# Patient Record
Sex: Female | Born: 2000 | Hispanic: No | Marital: Single | Smoking: Never smoker
Health system: Southern US, Community
[De-identification: ages and names within clinical notes are randomized; demographics above are authoritative.]

## PROBLEM LIST (undated history)

## (undated) VITALS — BP 102/67 | HR 134 | Temp 98.3°F | Resp 16 | Ht 61.81 in | Wt 97.0 lb

## (undated) DIAGNOSIS — F32A Depression, unspecified: Secondary | ICD-10-CM

## (undated) DIAGNOSIS — F329 Major depressive disorder, single episode, unspecified: Secondary | ICD-10-CM

## (undated) HISTORY — DX: Depression, unspecified: F32.A

## (undated) HISTORY — DX: Major depressive disorder, single episode, unspecified: F32.9

---
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
## 2020-01-14 ENCOUNTER — Other Ambulatory Visit: Payer: Self-pay

## 2020-01-14 ENCOUNTER — Ambulatory Visit (INDEPENDENT_AMBULATORY_CARE_PROVIDER_SITE_OTHER): Payer: No Typology Code available for payment source | Admitting: Psychiatry

## 2020-01-14 DIAGNOSIS — F3341 Major depressive disorder, recurrent, in partial remission: Secondary | ICD-10-CM | POA: Diagnosis not present

## 2020-01-14 DIAGNOSIS — F4010 Social phobia, unspecified: Secondary | ICD-10-CM

## 2020-01-14 MED ORDER — MODAFINIL 200 MG PO TABS: 200.0000 mg | ORAL_TABLET | Freq: Every day | ORAL | 0 refills | Status: DC

## 2020-01-14 NOTE — Progress Notes (Signed)
BH MD/PA/NP OP Progress Note  01/14/2020 2:12 PM Connie Hartman  MRN:  [id number] Interview was conducted by phone and I verified that I was speaking with the correct person using two identifiers. I discussed the limitations of evaluation and management by telemedicine and  the availability of in person appointments. Patient expressed understanding and agreed to proceed.  Chief Complaint: Daytime fatigue, falling asleep during the day.  HPI: 19 yo white female with a hx of MDD and social anxiety who has been most recently followed by Dr. Leanora Cover (earlier by Dr. Alyse Low). She has a hx of a single IP psychiatric admission in 2015due to suicidal ideation and self-harm (cutting). She wasthen prescribed escitalopram with good response. Her symptoms (depression, anxiety,low energyand intermittent suicidal thoughts intent, plan or act of self-harm returned within a year of discontinuation of antidepressant.[name] admitted to havinganxietyin socially unfamiliar surroundings (having to talk tounfamiliarpeople or in new situations). She worries about what peoplemaythink of her andjudge her. She was restarted on escitalopram with dose gradually increased to 20 mg. Buspirone was then added and later bupropion XL 150 mg to help with symptom of lack of motivation, inattention which she is describing currently. Finally in September 2019 escitalopram was changed to sertraline which she has stayed on since that time. She denies feeling particularly depressed but reports feeling tired, sleeping "too much" (falls asleep multiple times during the day), having problems with motivation/concentration. We have increased dose of Wellbutrin to 300 mg to combat that but not much has changed. Anxiety while present is not overwhelming. Appetite is normal. 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  Visit Diagnosis:    ICD-10-CM   1. Social anxiety disorder  F40.10   2.  Major depressive disorder, recurrent episode, in partial remission (HCC)  F33.41     Past Psychiatric History: Please see intake H&P.  Past Medical History:  Past Medical History:  Diagnosis Date  . Depression    No past surgical history on file.  Family Psychiatric History: None.  Family History: No family history on file.  Social History:  Social History   Socioeconomic History  . Marital status: Single    Spouse name: Not on file  . Number of children: Not on file  . Years of education: Not on file  . Highest education level: Not on file  Occupational History  . Not on file  Tobacco Use  . Smoking status: Never Smoker  . Smokeless tobacco: Never Used  Substance and Sexual Activity  . Alcohol use: No  . Drug use: No  . Sexual activity: Never    Birth control/protection: Abstinence  Other Topics Concern  . Not on file  Social History Narrative  . Not on file   Social Determinants of Health   Financial Resource Strain:   . Difficulty of Paying Living Expenses:   Food Insecurity:   . Worried About Programme researcher, broadcasting/film/video in the Last Year:   . Barista in the Last Year:   Transportation Needs:   . Freight forwarder (Medical):   Marland Kitchen Lack of Transportation (Non-Medical):   Physical Activity:   . Days of Exercise per Week:   . Minutes of Exercise per Session:   Stress:   . Feeling of Stress :   Social Connections:   . Frequency of Communication with Friends and Family:   . Frequency of Social Gatherings with Friends and Family:   . Attends Religious Services:   .  Active Member of Clubs or Organizations:   . Attends Banker Meetings:   Marland Kitchen Marital Status:     Allergies: No Known Allergies  Metabolic Disorder Labs: No results found for: HGBA1C, MPG No results found for: PROLACTIN No results found for: CHOL, TRIG, HDL, CHOLHDL, VLDL, LDLCALC Lab Results  Component Value Date   TSH 2.444 12/24/2013    Therapeutic Level Labs: No  results found for: LITHIUM No results found for: VALPROATE No components found for:  CBMZ  Current Medications: Current Outpatient Medications  Medication Sig Dispense Refill  . busPIRone (BUSPAR) 15 MG tablet Take one twice/day 180 tablet 3  . modafinil (PROVIGIL) 200 MG tablet Take 1 tablet (200 mg total) by mouth daily. 30 tablet 0  . sertraline (ZOLOFT) 100 MG tablet TAKE 1 TABLET BY MOUTH EVERY DAY 90 tablet 3   No current facility-administered medications for this visit.     Psychiatric Specialty Exam: Review of Systems  Constitutional: Positive for fatigue.  Psychiatric/Behavioral: Positive for decreased concentration. The patient is nervous/anxious.   All other systems reviewed and are negative.   There were no vitals taken for this visit.There is no height or weight on file to calculate BMI.  General Appearance: NA  Eye Contact:  NA  Speech:  Clear and Coherent and Normal Rate  Volume:  Normal  Mood:  Anxious  Affect:  NA  Thought Process:  Goal Directed and Linear  Orientation:  Full (Time, Place, and Person)  Thought Content: Logical   Suicidal Thoughts:  No  Homicidal Thoughts:  No  Memory:  Immediate;   Good Recent;   Good Remote;   Good  Judgement:  Good  Insight:  Fair  Psychomotor Activity:  NA  Concentration:  Concentration: Fair  Recall:  Good  Fund of Knowledge: Good  Language: Good  Akathisia:  Negative  Handed:  Right  AIMS (if indicated): not done  Assets:  Communication Skills Desire for Improvement Financial Resources/Insurance Housing Physical Health Social Support Talents/Skills  ADL's:  Intact  Cognition: Intact  Sleep:  Excessive daytime sleepiness.    Assessment and Plan: 19 yo white female with a hx of MDD and social anxiety who has been most recently followed by Dr. Leanora Cover (earlier by Dr. Alyse Low). She has a hx of a single IP psychiatric admission in 2015due to suicidal ideation and self-harm (cutting). She wasthen  prescribed escitalopram with good response. Her symptoms (depression, anxiety,low energyand intermittent suicidal thoughts intent, plan or act of self-harm returned within a year of discontinuation of antidepressant.[name] admitted to havinganxietyin socially unfamiliar surroundings (having to talk tounfamiliarpeople or in new situations). She worries about what peoplemaythink of her andjudge her. She was restarted on escitalopram with dose gradually increased to 20 mg. Buspirone was then added and later bupropion XL 150 mg to help with symptom of lack of motivation, inattention which she is describing currently. Finally in September 2019 escitalopram was changed to sertraline which she has stayed on since that time. She denies feeling particularly depressed but reports feeling tired, sleeping "too much" (falls asleep multiple times during the day), having problems with motivation/concentration. We have increased dose of Wellbutrin to 300 mg to combat that but not much has changed. Anxiety while present is not overwhelming. Appetite is normal. 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  Dx: MDD in partial remission; Social anxiety disorder; r/o narcolepsy  Plan: We will continue Zoloft 100 mg and Buspar 15 mg both  to HS. We will discontinue Wellbutrin XL 300 mg in AM and instead try modafinil 200 mg in AM for fatigue.concetration/sexcessive daytime sleepiness. Next appointment in a month.The plan was discussed with patient who had an opportunity to ask questions and these were all answered. I spend 95minutes in phone consultation with the patient.   Stephanie Acre, MD 01/14/2020, 2:12 PM

## 2020-02-17 ENCOUNTER — Other Ambulatory Visit (HOSPITAL_COMMUNITY): Payer: Self-pay | Admitting: Psychiatry

## 2020-02-18 ENCOUNTER — Ambulatory Visit (INDEPENDENT_AMBULATORY_CARE_PROVIDER_SITE_OTHER): Payer: No Typology Code available for payment source | Admitting: Psychiatry

## 2020-02-18 ENCOUNTER — Other Ambulatory Visit: Payer: Self-pay

## 2020-02-18 DIAGNOSIS — F3341 Major depressive disorder, recurrent, in partial remission: Secondary | ICD-10-CM | POA: Diagnosis not present

## 2020-02-18 DIAGNOSIS — F4010 Social phobia, unspecified: Secondary | ICD-10-CM

## 2020-02-18 MED ORDER — METHYLPHENIDATE HCL 10 MG PO TABS: 10.0000 mg | ORAL_TABLET | Freq: Two times a day (BID) | ORAL | 0 refills | Status: DC

## 2020-02-18 NOTE — Progress Notes (Signed)
BH MD/PA/NP OP Progress Note  02/18/2020 2:11 PM Connie Hartman  MRN:  [id number] Interview was conducted by phone and I verified that I was speaking with the correct person using two identifiers. I discussed the limitations of evaluation and management by telemedicine and  the availability of in person appointments. Patient expressed understanding and agreed to proceed.  Chief Complaint: "I still feel very tired".  HPI: 19 yo white female with a hx of MDD and social anxiety who has been most recently followed by Dr. Leanora Hartman (earlier by Dr. Alyse Hartman). She has a hx of a single IP psychiatric admission in 2015due to suicidal ideation and self-harm (cutting). She wasthen prescribed escitalopram with good response. Her symptoms (depression, anxiety,Hartman energyand intermittent suicidal thoughts intent, plan or act of self-harm returned within a year of discontinuation of antidepressant.[name] admitted to havinganxietyin socially unfamiliar surroundings (having to talk tounfamiliarpeople or in new situations). She worries about what peoplemaythink of her andjudge her. She was restarted on escitalopram with dose gradually increased to 20 mg. Buspirone was then added and later bupropion XL 150 mg to help with symptom of lack of motivation, inattention which she is describing currently.Finallyin September 2019 escitalopram was changed to sertraline which she has stayed on since that time. She denies feeling particularly depressed but reports feeling tired, sleeping "too much" (falls asleep multiple times during the day), having problems with motivation/concentration.We have increased dose of Wellbutrin to 300 mg to combat that but not much has changed.I then ordered modafinil 200 mg but it required pre-authorization which we were not notified about so it was not done. Anxiety while present is not overwhelming. 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   Visit Diagnosis:    ICD-10-CM   1. Major depressive disorder, recurrent episode, in partial remission (HCC)  F33.41   2. Social anxiety disorder  F40.10     Past Psychiatric History: Please see intake H&P.  Past Medical History:  Past Medical History:  Diagnosis Date  . Depression    No past surgical history on file.  Family Psychiatric History: None.  Family History: No family history on file.  Social History:  Social History   Socioeconomic History  . Marital status: Single    Spouse name: Not on file  . Number of children: Not on file  . Years of education: Not on file  . Highest education level: Not on file  Occupational History  . Not on file  Tobacco Use  . Smoking status: Never Smoker  . Smokeless tobacco: Never Used  Substance and Sexual Activity  . Alcohol use: No  . Drug use: No  . Sexual activity: Never    Birth control/protection: Abstinence  Other Topics Concern  . Not on file  Social History Narrative  . Not on file   Social Determinants of Health   Financial Resource Strain:   . Difficulty of Paying Living Expenses:   Food Insecurity:   . Worried About Programme researcher, broadcasting/film/video in the Last Year:   . Barista in the Last Year:   Transportation Needs:   . Freight forwarder (Medical):   Marland Kitchen Lack of Transportation (Non-Medical):   Physical Activity:   . Days of Exercise per Week:   . Minutes of Exercise per Session:   Stress:   . Feeling of Stress :   Social Connections:   . Frequency of Communication with Friends and Family:   .  Frequency of Social Gatherings with Friends and Family:   . Attends Religious Services:   . Active Member of Clubs or Organizations:   . Attends Banker Meetings:   Marland Kitchen Marital Status:     Allergies: No Known Allergies  Metabolic Disorder Labs: No results found for: HGBA1C, MPG No results found for: PROLACTIN No results found for: CHOL, TRIG, HDL, CHOLHDL, VLDL,  LDLCALC Lab Results  Component Value Date   TSH 2.444 12/24/2013    Therapeutic Level Labs: No results found for: LITHIUM No results found for: VALPROATE No components found for:  CBMZ  Current Medications: Current Outpatient Medications  Medication Sig Dispense Refill  . busPIRone (BUSPAR) 15 MG tablet Take one twice/day 180 tablet 3  . methylphenidate (RITALIN) 10 MG tablet Take 1 tablet (10 mg total) by mouth 2 (two) times daily with breakfast and lunch. 60 tablet 0  . [START ON 03/19/2020] methylphenidate (RITALIN) 10 MG tablet Take 1 tablet (10 mg total) by mouth 2 (two) times daily with breakfast and lunch. 60 tablet 0  . sertraline (ZOLOFT) 100 MG tablet TAKE 1 TABLET BY MOUTH EVERY DAY 90 tablet 3   No current facility-administered medications for this visit.     Psychiatric Specialty Exam: Review of Systems  Constitutional: Positive for fatigue.  Psychiatric/Behavioral: Positive for decreased concentration.  All other systems reviewed and are negative.   There were no vitals taken for this visit.There is no height or weight on file to calculate BMI.  General Appearance: NA  Eye Contact:  NA  Speech:  Clear and Coherent and Normal Rate  Volume:  Normal  Mood:  Euthymic  Affect:  NA  Thought Process:  Goal Directed and Linear  Orientation:  Full (Time, Place, and Person)  Thought Content: Logical   Suicidal Thoughts:  No  Homicidal Thoughts:  No  Memory:  Immediate;   Good Recent;   Good Remote;   Good  Judgement:  Good  Insight:  Good  Psychomotor Activity:  NA  Concentration:  Concentration: Good  Recall:  Good  Fund of Knowledge: Good  Language: Good  Akathisia:  Negative  Handed:  Right  AIMS (if indicated): not done  Assets:  Communication Skills Desire for Improvement Financial Resources/Insurance Housing Physical Health Social Support Talents/Skills  ADL's:  Intact  Cognition: WNL  Sleep:  Good    Assessment and Plan: 19 yo white female  with a hx of MDD and social anxiety who has been most recently followed by Dr. Leanora Hartman (earlier by Dr. Alyse Hartman). She has a hx of a single IP psychiatric admission in 2015due to suicidal ideation and self-harm (cutting). She wasthen prescribed escitalopram with good response. Her symptoms (depression, anxiety,Hartman energyand intermittent suicidal thoughts intent, plan or act of self-harm returned within a year of discontinuation of antidepressant.[name] admitted to havinganxietyin socially unfamiliar surroundings (having to talk tounfamiliarpeople or in new situations). She worries about what peoplemaythink of her andjudge her. She was restarted on escitalopram with dose gradually increased to 20 mg. Buspirone was then added and later bupropion XL 150 mg to help with symptom of lack of motivation, inattention which she is describing currently.Finallyin September 2019 escitalopram was changed to sertraline which she has stayed on since that time. She denies feeling particularly depressed but reports feeling tired, sleeping "too much" (falls asleep multiple times during the day), having problems with motivation/concentration.We have increased dose of Wellbutrin to 300 mg to combat that but not much has changed.I then ordered  modafinil 200 mg but it required pre-authorization which we were not notified about so it was not done. Anxiety while present is not overwhelming. 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  Dx: MDD in partial remission; Social anxiety disorder; r/o narcolepsy  Plan: We will continue Zoloft 100 mg and Buspar 15 mgboth to HS. We will hold off on getting modafinil approved and will try Ritalin 10 mg bid at this time for daytime fatigue/concetration/sexcessive daytime sleepiness.Next appointment in two months.The plan was discussed with patient who had an opportunity to ask questions and these were all  answered. I spend44minutes in phone consultation with the patient.    Stephanie Acre, MD 02/18/2020, 2:11 PM

## 2020-03-12 ENCOUNTER — Ambulatory Visit: Payer: No Typology Code available for payment source | Attending: Internal Medicine

## 2020-03-12 DIAGNOSIS — Z20822 Contact with and (suspected) exposure to covid-19: Secondary | ICD-10-CM

## 2020-03-13 LAB — NOVEL CORONAVIRUS, NAA: SARS-CoV-2, NAA: NOT DETECTED

## 2020-03-13 LAB — SARS-COV-2, NAA 2 DAY TAT

## 2020-04-22 ENCOUNTER — Telehealth (INDEPENDENT_AMBULATORY_CARE_PROVIDER_SITE_OTHER): Payer: No Typology Code available for payment source | Admitting: Psychiatry

## 2020-04-22 ENCOUNTER — Other Ambulatory Visit: Payer: Self-pay

## 2020-04-22 DIAGNOSIS — F3341 Major depressive disorder, recurrent, in partial remission: Secondary | ICD-10-CM

## 2020-04-22 DIAGNOSIS — F4010 Social phobia, unspecified: Secondary | ICD-10-CM | POA: Diagnosis not present

## 2020-04-22 MED ORDER — METHYLPHENIDATE HCL 10 MG PO TABS: 10.0000 mg | ORAL_TABLET | Freq: Two times a day (BID) | ORAL | 0 refills | Status: DC

## 2020-04-22 MED ORDER — METHYLPHENIDATE HCL 10 MG PO TABS
10.0000 mg | ORAL_TABLET | Freq: Two times a day (BID) | ORAL | 0 refills | Status: DC
Start: 2020-06-21 — End: 2020-07-23

## 2020-04-22 MED ORDER — SERTRALINE HCL 100 MG PO TABS: ORAL_TABLET | ORAL | 3 refills | Status: DC

## 2020-04-22 NOTE — Progress Notes (Signed)
BH MD/PA/NP OP Progress Note  04/22/2020 2:07 PM Connie Hartman  MRN:  [id number] Interview was conducted using videoconferencing application and I verified that I was speaking with the correct person using two identifiers. I discussed the limitations of evaluation and management by telemedicine and  the availability of in person appointments. Patient expressed understanding and agreed to proceed. Patient location - home; physician - home office.  Chief Complaint: "Ritalin has been helpful".  HPI: 19 yo white female with a hx of MDD and social anxiety who has been most recently followed by Dr. Leanora Hartman (earlier by Dr. Alyse Hartman). She has a hx of a single IP psychiatric admission in 2015due to suicidal ideation and self-harm (cutting). She wasthen prescribed escitalopram with good response. Her symptoms (depression, anxiety,Hartman energyand intermittent suicidal thoughts intent, plan or act of self-harm returned within a year of discontinuation of antidepressant.Connie Hartman admitted to havinganxietyin socially unfamiliar surroundings (having to talk tounfamiliarpeople or in new situations). She worries about what peoplemaythink of her andjudge her. She was restarted on escitalopram with dose gradually increased to 20 mg. Buspirone was then added and later bupropion XL 150 mg to help with symptom of lack of motivation, inattention which she is describing currently.Finallyin September2019escitalopram was changed to sertraline which she has stayed on since that time. She denies feeling particularly depressed but reports feeling tired, sleeping "too much"(falls asleep multiple times during the day), having problems with motivation/concentration.We haveincreased dose ofWellbutrinto 300 mgto combat that but not much had changed.I then ordered modafinil 200 mg but it required pre-authorization which we were not notified about so it was not done. We then started Ritalin 10 mg bid and Adja reports  marked improvement in energy, focusing. Anxiety is minimal; appetite and sleep normal. 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  Visit Diagnosis:    ICD-10-CM   1. Major depressive disorder, recurrent episode, in partial remission (HCC)  F33.41   2. Social anxiety disorder  F40.10     Past Psychiatric History: Please see intake H&P.  Past Medical History:  Past Medical History:  Diagnosis Date  . Depression    No past surgical history on file.  Family Psychiatric History: None.  Family History: No family history on file.  Social History:  Social History   Socioeconomic History  . Marital status: Single    Spouse name: Not on file  . Number of children: Not on file  . Years of education: Not on file  . Highest education level: Not on file  Occupational History  . Not on file  Tobacco Use  . Smoking status: Never Smoker  . Smokeless tobacco: Never Used  Vaping Use  . Vaping Use: Never used  Substance and Sexual Activity  . Alcohol use: No  . Drug use: No  . Sexual activity: Never    Birth control/protection: Abstinence  Other Topics Concern  . Not on file  Social History Narrative  . Not on file   Social Determinants of Health   Financial Resource Strain:   . Difficulty of Paying Living Expenses:   Food Insecurity:   . Worried About Programme researcher, broadcasting/film/video in the Last Year:   . Barista in the Last Year:   Transportation Needs:   . Freight forwarder (Medical):   Marland Kitchen Lack of Transportation (Non-Medical):   Physical Activity:   . Days of Exercise per Week:   . Minutes of Exercise per Session:   Stress:   . Feeling of Stress :  Social Connections:   . Frequency of Communication with Friends and Family:   . Frequency of Social Gatherings with Friends and Family:   . Attends Religious Services:   . Active Member of Clubs or Organizations:   . Attends Archivist Meetings:   Marland Kitchen Marital Status:     Allergies: No Known  Allergies  Metabolic Disorder Labs: No results found for: HGBA1C, MPG No results found for: PROLACTIN No results found for: CHOL, TRIG, HDL, CHOLHDL, VLDL, LDLCALC Lab Results  Component Value Date   TSH 2.444 12/24/2013    Therapeutic Level Labs: No results found for: LITHIUM No results found for: VALPROATE No components found for:  CBMZ  Current Medications: Current Outpatient Medications  Medication Sig Dispense Refill  . busPIRone (BUSPAR) 15 MG tablet Take one twice/day 180 tablet 3  . methylphenidate (RITALIN) 10 MG tablet Take 1 tablet (10 mg total) by mouth 2 (two) times daily with breakfast and lunch. 60 tablet 0  . methylphenidate (RITALIN) 10 MG tablet Take 1 tablet (10 mg total) by mouth 2 (two) times daily with breakfast and lunch. 60 tablet 0  . methylphenidate (RITALIN) 10 MG tablet Take 1 tablet (10 mg total) by mouth 2 (two) times daily with breakfast and lunch. 60 tablet 0  . [START ON 05/22/2020] methylphenidate (RITALIN) 10 MG tablet Take 1 tablet (10 mg total) by mouth 2 (two) times daily with breakfast and lunch. 60 tablet 0  . [START ON 06/21/2020] methylphenidate (RITALIN) 10 MG tablet Take 1 tablet (10 mg total) by mouth 2 (two) times daily with breakfast and lunch. 60 tablet 0  . sertraline (ZOLOFT) 100 MG tablet TAKE 1 TABLET BY MOUTH EVERY DAY 90 tablet 3   No current facility-administered medications for this visit.    Psychiatric Specialty Exam: Review of Systems  Psychiatric/Behavioral: The patient is nervous/anxious.   All other systems reviewed and are negative.   There were no vitals taken for this visit.There is no height or weight on file to calculate BMI.  General Appearance: Casual and Well Groomed  Eye Contact:  Good  Speech:  Clear and Coherent and Normal Rate  Volume:  Normal  Mood:  Mild anxiety  Affect:  Full Range  Thought Process:  Goal Directed and Linear  Orientation:  Full (Time, Place, and Person)  Thought Content: Logical    Suicidal Thoughts:  No  Homicidal Thoughts:  No  Memory:  Immediate;   Good Recent;   Good Remote;   Good  Judgement:  Good  Insight:  Good  Psychomotor Activity:  Normal  Concentration:  Concentration: Good  Recall:  Good  Fund of Knowledge: Good  Language: Good  Akathisia:  Negative  Handed:  Right  AIMS (if indicated): not done  Assets:  Communication Skills Desire for Improvement Financial Resources/Insurance Housing Physical Health Social Support Vocational/Educational  ADL's:  Intact  Cognition: WNL  Sleep:  Good   Assessment and Plan: 19 yo white female with a hx of MDD and social anxiety who has been most recently followed by Dr. Renold Genta (earlier by Dr. Eduard Clos). She has a hx of a single IP psychiatric admission in 2015due to suicidal ideation and self-harm (cutting). She wasthen prescribed escitalopram with good response. Her symptoms (depression, anxiety,Hartman energyand intermittent suicidal thoughts intent, plan or act of self-harm returned within a year of discontinuation of antidepressant.Connie Hartman admitted to havinganxietyin socially unfamiliar surroundings (having to talk tounfamiliarpeople or in new situations). She worries about what peoplemaythink of  her andjudge her. She was restarted on escitalopram with dose gradually increased to 20 mg. Buspirone was then added and later bupropion XL 150 mg to help with symptom of lack of motivation, inattention which she is describing currently.Finallyin September2019escitalopram was changed to sertraline which she has stayed on since that time. She denies feeling particularly depressed but reports feeling tired, sleeping "too much"(falls asleep multiple times during the day), having problems with motivation/concentration.We haveincreased dose ofWellbutrinto 300 mgto combat that but not much had changed.I then ordered modafinil 200 mg but it required pre-authorization which we were not notified about so  it was not done. We then started Ritalin 10 mg bid and Terie reports marked improvement in energy, focusing. Anxiety is minimal; appetite and sleep normal. 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  Dx: MDD inpartialremission; Social anxiety disorder; r/o narcolepsy  Plan: We will continue Zoloft 100 mg, Buspar 15 mgboth to HS and Ritalin 10 mg bid for daytime fatigue/concetration/sexcessive daytime sleepiness.Next appointment inthree months.The plan was discussed with patient who had an opportunity to ask questions and these were all answered. I spend12minutes in videoconference with the patient.    Magdalene Patricia, MD 04/22/2020, 2:07 PM

## 2020-05-26 ENCOUNTER — Ambulatory Visit: Payer: Medicaid Other | Attending: Internal Medicine

## 2020-05-26 DIAGNOSIS — Z20822 Contact with and (suspected) exposure to covid-19: Secondary | ICD-10-CM

## 2020-05-27 LAB — NOVEL CORONAVIRUS, NAA: SARS-CoV-2, NAA: NOT DETECTED

## 2020-05-27 LAB — SARS-COV-2, NAA 2 DAY TAT

## 2020-07-08 ENCOUNTER — Telehealth (HOSPITAL_COMMUNITY): Payer: Self-pay | Admitting: *Deleted

## 2020-07-08 NOTE — Telephone Encounter (Signed)
Got it!     Thanks =).

## 2020-07-08 NOTE — Telephone Encounter (Signed)
Opened in error

## 2020-07-08 NOTE — Telephone Encounter (Signed)
Pt called requesting refill of Ritalin 10 mg. Pt has an upcoming appointment on 07/23/20. Please review.

## 2020-07-08 NOTE — Telephone Encounter (Signed)
This is Dr Hinton Dyer patient. Let me know if he is on PAL. Thanks

## 2020-07-23 ENCOUNTER — Telehealth (HOSPITAL_COMMUNITY): Payer: No Typology Code available for payment source | Admitting: Psychiatry

## 2020-07-23 ENCOUNTER — Other Ambulatory Visit: Payer: Self-pay

## 2020-07-23 ENCOUNTER — Other Ambulatory Visit (HOSPITAL_COMMUNITY): Payer: Self-pay | Admitting: Psychiatry

## 2020-07-23 MED ORDER — METHYLPHENIDATE HCL 10 MG PO TABS: 10.0000 mg | ORAL_TABLET | Freq: Two times a day (BID) | ORAL | 0 refills | Status: DC

## 2020-08-05 ENCOUNTER — Telehealth (INDEPENDENT_AMBULATORY_CARE_PROVIDER_SITE_OTHER): Payer: Self-pay | Admitting: Psychiatry

## 2020-08-05 ENCOUNTER — Other Ambulatory Visit: Payer: Self-pay

## 2020-08-05 DIAGNOSIS — F3341 Major depressive disorder, recurrent, in partial remission: Secondary | ICD-10-CM

## 2020-08-05 DIAGNOSIS — F4010 Social phobia, unspecified: Secondary | ICD-10-CM

## 2020-08-05 MED ORDER — SERTRALINE HCL 100 MG PO TABS: ORAL_TABLET | ORAL | 3 refills | Status: DC

## 2020-08-05 MED ORDER — METHYLPHENIDATE HCL 10 MG PO TABS
10.0000 mg | ORAL_TABLET | Freq: Two times a day (BID) | ORAL | 0 refills | Status: DC
Start: 2020-08-05 — End: 2020-11-03

## 2020-08-05 MED ORDER — BUSPIRONE HCL 15 MG PO TABS: ORAL_TABLET | ORAL | 3 refills | Status: DC

## 2020-08-05 MED ORDER — METHYLPHENIDATE HCL 10 MG PO TABS
10.0000 mg | ORAL_TABLET | Freq: Two times a day (BID) | ORAL | 0 refills | Status: DC
Start: 2020-09-04 — End: 2020-11-03

## 2020-08-05 MED ORDER — METHYLPHENIDATE HCL 10 MG PO TABS
10.0000 mg | ORAL_TABLET | Freq: Two times a day (BID) | ORAL | 0 refills | Status: DC
Start: 2020-10-04 — End: 2020-11-03

## 2020-08-05 NOTE — Progress Notes (Signed)
BH MD/PA/NP OP Progress Note  08/05/2020 3:41 PM Connie Hartman  MRN:  [id number] Interview was conducted by phone and I verified that I was speaking with the correct person using two identifiers. I discussed the limitations of evaluation and management by telemedicine and  the availability of in person appointments. Patient expressed understanding and agreed to proceed. Patient location - home; physician - home office.  Chief Complaint: Fatigue, anxiety.  HPI: 19 yo white female with a hx of MDD and social anxiety. She has a hx of a single IP psychiatric admission in 2015due to suicidal ideation and self-harm (cutting). She wasthen prescribed escitalopram with good response. Her symptoms (depression, anxiety,low energyand intermittent suicidal thoughts intent, plan or act of self-harm returned within a year of discontinuation of antidepressant.[name] admitted to havinganxietyin socially unfamiliar surroundings (having to talk tounfamiliarpeople or in new situations). She worries about what peoplemaythink of her andjudge her. She was restarted on escitalopram with dose gradually increased to 20 mg. Buspirone was then added and later bupropion XL 150 mg to help with symptom of lack of motivation, inattention which she is describing currently.Finallyin September2019escitalopram was changed to sertraline which she has stayed on since that time. She denies feeling particularly depressed but reports feeling tired, sleeping "too much"(falls asleep multiple times during the day), having problems with motivation/concentration.We haveincreased dose ofWellbutrinto 300 mgto combat that but not much has changed.I then ordered modafinil 200 mg but it required pre-authorization which we were not notified about so it was not done. Anxiety while present is not overwhelming. We have added Ritalin 109 mg bd for fatigue (possible narcolepsy) and she noticed clear improvement. Deari mostly needs to  take it just once daily in AM. 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   Visit Diagnosis:    ICD-10-CM   1. Major depressive disorder, recurrent episode, in partial remission (HCC)  F33.41   2. Social anxiety disorder  F40.10     Past Psychiatric History: Please see intake H&P.  Past Medical History:  Past Medical History:  Diagnosis Date  . Depression    No past surgical history on file.  Family Psychiatric History: None.  Family History: No family history on file.  Social History:  Social History   Socioeconomic History  . Marital status: Single    Spouse name: Not on file  . Number of children: Not on file  . Years of education: Not on file  . Highest education level: Not on file  Occupational History  . Not on file  Tobacco Use  . Smoking status: Never Smoker  . Smokeless tobacco: Never Used  Vaping Use  . Vaping Use: Never used  Substance and Sexual Activity  . Alcohol use: No  . Drug use: No  . Sexual activity: Never    Birth control/protection: Abstinence  Other Topics Concern  . Not on file  Social History Narrative  . Not on file   Social Determinants of Health   Financial Resource Strain:   . Difficulty of Paying Living Expenses: Not on file  Food Insecurity:   . Worried About Programme researcher, broadcasting/film/video in the Last Year: Not on file  . Ran Out of Food in the Last Year: Not on file  Transportation Needs:   . Lack of Transportation (Medical): Not on file  . Lack of Transportation (Non-Medical): Not on file  Physical Activity:   . Days of Exercise per Week: Not on file  . Minutes  of Exercise per Session: Not on file  Stress:   . Feeling of Stress : Not on file  Social Connections:   . Frequency of Communication with Friends and Family: Not on file  . Frequency of Social Gatherings with Friends and Family: Not on file  . Attends Religious Services: Not on file  . Active Member of  Clubs or Organizations: Not on file  . Attends Banker Meetings: Not on file  . Marital Status: Not on file    Allergies: No Known Allergies  Metabolic Disorder Labs: No results found for: HGBA1C, MPG No results found for: PROLACTIN No results found for: CHOL, TRIG, HDL, CHOLHDL, VLDL, LDLCALC Lab Results  Component Value Date   TSH 2.444 12/24/2013    Therapeutic Level Labs: No results found for: LITHIUM No results found for: VALPROATE No components found for:  CBMZ  Current Medications: Current Outpatient Medications  Medication Sig Dispense Refill  . busPIRone (BUSPAR) 15 MG tablet Take one twice/day 180 tablet 3  . methylphenidate (RITALIN) 10 MG tablet Take 1 tablet (10 mg total) by mouth 2 (two) times daily with breakfast and lunch. 60 tablet 0  . [START ON 09/04/2020] methylphenidate (RITALIN) 10 MG tablet Take 1 tablet (10 mg total) by mouth 2 (two) times daily with breakfast and lunch. 60 tablet 0  . [START ON 10/04/2020] methylphenidate (RITALIN) 10 MG tablet Take 1 tablet (10 mg total) by mouth 2 (two) times daily with breakfast and lunch. 60 tablet 0  . sertraline (ZOLOFT) 100 MG tablet TAKE 1 TABLET BY MOUTH IN THE EVENING 90 tablet 3   No current facility-administered medications for this visit.       Psychiatric Specialty Exam: Review of Systems  Constitutional: Positive for fatigue.  Psychiatric/Behavioral: The patient is nervous/anxious.     There were no vitals taken for this visit.There is no height or weight on file to calculate BMI.  General Appearance: NA  Eye Contact:  NA  Speech:  Clear and Coherent and Normal Rate  Volume:  Normal  Mood:  Anxious  Affect:  NA  Thought Process:  Goal Directed and Linear  Orientation:  Full (Time, Place, and Person)  Thought Content: Logical   Suicidal Thoughts:  No  Homicidal Thoughts:  No  Memory:  Immediate;   Good Recent;   Good Remote;   Good  Judgement:  Good  Insight:  Good   Psychomotor Activity:  NA  Concentration:  Concentration: Good  Recall:  Good  Fund of Knowledge: Good  Language: Good  Akathisia:  Negative  Handed:  Right  AIMS (if indicated): not done  Assets:  Communication Skills Desire for Improvement Financial Resources/Insurance Housing Physical Health Vocational/Educational  ADL's:  Intact  Cognition: WNL  Sleep:  Good     Assessment and Plan: 19 yo white female with a hx of MDD and social anxiety. She has a hx of a single IP psychiatric admission in 2015due to suicidal ideation and self-harm (cutting). She wasthen prescribed escitalopram with good response. Her symptoms such as depression, anxiety,low energyand intermittent suicidal thoughts intent, plan or act of self-harm returned within a year of discontinuation of antidepressant.[name] admitted to havinganxietyin socially unfamiliar surroundings (having to talk tounfamiliarpeople or in new situations). She worries about what peoplemaythink of her andjudge her. She was restarted on escitalopram with dose gradually increased to 20 mg. Buspirone was then added and later bupropion XL 150 mg to help with symptom of lack of motivation, inattention which she  is describing currently.Finallyin September2019escitalopram was changed to sertraline which she has stayed on since that time. She denies feeling particularly depressed but reports feeling tired, sleeping "too much"(falls asleep multiple times during the day), having problems with motivation/concentration.We haveincreased dose ofWellbutrinto 300 mgto combat that but not much has changed.I then ordered modafinil 200 mg but it required pre-authorization which we were not notified about so it was not done. Anxiety while present is not overwhelming. We have added Ritalin 109 mg bd for fatigue (possible narcolepsy) and she noticed clear improvement. Shanequia mostly needs to take it just once daily in AM. 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  Dx: MDD inpartialremission; Social anxiety disorder  Plan: We will continue Zoloft 100 mg and Buspar 15 mgboth to HS and Ritalin 10 mg bid at this time for daytime fatigue/concetration/sexcessive daytime sleepiness.Next appointment inthree months.The plan was discussed with patient who had an opportunity to ask questions and these were all answered. I spend22minutes in phone consultation with the patient.   Magdalene Patricia, MD 08/05/2020, 3:41 PM

## 2020-11-03 ENCOUNTER — Telehealth (INDEPENDENT_AMBULATORY_CARE_PROVIDER_SITE_OTHER): Payer: BC Managed Care – PPO | Admitting: Psychiatry

## 2020-11-03 ENCOUNTER — Other Ambulatory Visit: Payer: Self-pay

## 2020-11-03 DIAGNOSIS — F3341 Major depressive disorder, recurrent, in partial remission: Secondary | ICD-10-CM

## 2020-11-03 DIAGNOSIS — F4010 Social phobia, unspecified: Secondary | ICD-10-CM | POA: Diagnosis not present

## 2020-11-03 MED ORDER — METHYLPHENIDATE HCL 10 MG PO TABS: 10.0000 mg | ORAL_TABLET | Freq: Every day | ORAL | 0 refills | Status: DC

## 2020-11-03 NOTE — Progress Notes (Signed)
[facility] MD/PA/NP OP Progress Note  11/03/2020 2:07 PM Connie Hartman  MRN:  [id number] Interview was conducted by phone and I verified that I was speaking with the correct person using two identifiers. I discussed the limitations of evaluation and management by telemedicine and  the availability of in person appointments. Patient expressed understanding and agreed to proceed. Participants in the visit: patient (location - home); physician (location - home office).  Chief Complaint: None.  HPI: 20 yo white female with a hx of MDD and social anxiety. She has a hx of a single IP psychiatric admission in 2015due to suicidal ideation and self-harm (cutting). She wasthen prescribed escitalopram with good response. Her symptoms such as depression, anxiety,low energyand intermittent suicidal thoughts intent, plan or act of self-harm returned within a year of discontinuation of antidepressant.[name] admitted to havinganxietyin socially unfamiliar surroundings (having to talk tounfamiliarpeople or in new situations). She worries about what peoplemaythink of her andjudge her. She was restarted on escitalopram with dose gradually increased to 20 mg. Buspirone was then added and later bupropion XL 150 mg to help with symptom of lack of motivation, inattention which she is describing currently.Finallyin September2019escitalopram was changed to sertraline which she has stayed on since that time. She denies feeling particularly depressed but reports feeling tired, sleeping "too much"(falls asleep multiple times during the day), having problems with motivation/concentration.We haveincreased dose ofWellbutrinto 300 mgto combat that but not much has changed.I then ordered modafinil 200 mg but it required pre-authorization which we were not notified about so it was not done.Anxiety while present is not overwhelming. We have added Ritalin 10 mg ifor fatigue (possible narcolepsy) and she noticed clear  improvement. Connie Hartman mostly needs to take it just once daily in AM. 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   Visit Diagnosis:    ICD-10-CM   1. Major depressive disorder, recurrent episode, in partial remission (Connie Hartman)  F33.41   2. Social anxiety disorder  F40.10     Past Psychiatric History: Please see intake H&P.  Past Medical History:  Past Medical History:  Diagnosis Date  . Depression    No past surgical history on file.  Family Psychiatric History: None.  Family History: No family history on file.  Social History:  Social History   Socioeconomic History  . Marital status: Single    Spouse name: Not on file  . Number of children: Not on file  . Years of education: Not on file  . Highest education level: Not on file  Occupational History  . Not on file  Tobacco Use  . Smoking status: Never Smoker  . Smokeless tobacco: Never Used  Vaping Use  . Vaping Use: Never used  Substance and Sexual Activity  . Alcohol use: No  . Drug use: No  . Sexual activity: Never    Birth control/protection: Abstinence  Other Topics Concern  . Not on file  Social History Narrative  . Not on file   Social Determinants of Health   Financial Resource Strain: Not on file  Food Insecurity: Not on file  Transportation Needs: Not on file  Physical Activity: Not on file  Stress: Not on file  Social Connections: Not on file    Allergies: No Known Allergies  Metabolic Disorder Labs: No results found for: HGBA1C, MPG No results found for: PROLACTIN No results found for: CHOL, TRIG, HDL, CHOLHDL, VLDL, LDLCALC Lab Results  Component Value Date   TSH 2.444 12/24/2013  Therapeutic Level Labs: No results found for: LITHIUM No results found for: VALPROATE No components found for:  CBMZ  Current Medications: Current Outpatient Medications  Medication Sig Dispense Refill  . methylphenidate (RITALIN) 10 MG  tablet Take 1 tablet (10 mg total) by mouth daily before breakfast. 30 tablet 0  . [START ON 12/03/2020] methylphenidate (RITALIN) 10 MG tablet Take 1 tablet (10 mg total) by mouth daily before breakfast. 30 tablet 0  . [START ON 01/02/2021] methylphenidate (RITALIN) 10 MG tablet Take 1 tablet (10 mg total) by mouth daily before breakfast. 30 tablet 0  . busPIRone (BUSPAR) 15 MG tablet Take one twice/day 180 tablet 3  . sertraline (ZOLOFT) 100 MG tablet TAKE 1 TABLET BY MOUTH IN THE EVENING 90 tablet 3   No current facility-administered medications for this visit.     Psychiatric Specialty Exam: Review of Systems  All other systems reviewed and are negative.   There were no vitals taken for this visit.There is no height or weight on file to calculate BMI.  General Appearance: NA  Eye Contact:  NA  Speech:  Clear and Coherent and Normal Rate  Volume:  Normal  Mood:  Euthymic  Affect:  NA  Thought Process:  Goal Directed  Orientation:  Full (Time, Place, and Person)  Thought Content: Logical   Suicidal Thoughts:  No  Homicidal Thoughts:  No  Memory:  Immediate;   Good Recent;   Good Remote;   Good  Judgement:  Good  Insight:  Good  Psychomotor Activity:  NA  Concentration:  Concentration: Fair  Recall:  Good  Fund of Knowledge: Good  Language: Good  Akathisia:  Negative  Handed:  Right  AIMS (if indicated): not done  Assets:  Communication Skills Desire for Improvement Financial Resources/Insurance Housing Physical Health Social Support Vocational/Educational  ADL's:  Intact  Cognition: WNL  Sleep:  Good    Assessment and Plan: 20 yo white female with a hx of MDD and social anxiety. She has a hx of a single IP psychiatric admission in 2015due to suicidal ideation and self-harm (cutting). She wasthen prescribed escitalopram with good response. Her symptoms such as depression, anxiety,low energyand intermittent suicidal thoughts intent, plan or act of self-harm  returned within a year of discontinuation of antidepressant.[name] admitted to havinganxietyin socially unfamiliar surroundings (having to talk tounfamiliarpeople or in new situations). She worries about what peoplemaythink of her andjudge her. She was restarted on escitalopram with dose gradually increased to 20 mg. Buspirone was then added and later bupropion XL 150 mg to help with symptom of lack of motivation, inattention which she is describing currently.Finallyin September2019escitalopram was changed to sertraline which she has stayed on since that time. She denies feeling particularly depressed but reports feeling tired, sleeping "too much"(falls asleep multiple times during the day), having problems with motivation/concentration.We haveincreased dose ofWellbutrinto 300 mgto combat that but not much has changed.I then ordered modafinil 200 mg but it required pre-authorization which we were not notified about so it was not done.Anxiety while present is not overwhelming. We have added Ritalin 10 mg ifor fatigue (possible narcolepsy) and she noticed clear improvement. Nema mostly needs to take it just once daily in AM. 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  Dx: MDD inpartialremission; Social anxiety disorder  Plan: We will continue Zoloft 100 mg and Buspar 15 mgboth to HS and Ritalin 10 mg in AM for daytime fatigue/concetration/excessive daytime sleepiness.Next appointment inthreemonths.The plan was discussed with  patient who had an opportunity to ask questions and these were all answered. I spend58minutes in phone consultation with the patient.   Magdalene Patricia, MD 11/03/2020, 2:07 PM

## 2021-01-28 ENCOUNTER — Telehealth (HOSPITAL_COMMUNITY): Payer: Self-pay | Admitting: Psychiatry

## 2024-06-05 ENCOUNTER — Encounter: Payer: Self-pay | Admitting: Internal Medicine

## 2024-06-05 ENCOUNTER — Ambulatory Visit: Admitting: Internal Medicine

## 2024-06-05 VITALS — BP 100/70 | HR 94 | Temp 98.0°F | Ht 63.0 in | Wt 108.8 lb

## 2024-06-05 DIAGNOSIS — F3341 Major depressive disorder, recurrent, in partial remission: Secondary | ICD-10-CM | POA: Diagnosis not present

## 2024-06-05 DIAGNOSIS — F4010 Social phobia, unspecified: Secondary | ICD-10-CM

## 2024-06-05 LAB — CBC WITH DIFFERENTIAL/PLATELET
Basophils Absolute: 0.1 K/uL (ref 0.0–0.1)
Basophils Relative: 1.1 % (ref 0.0–3.0)
Eosinophils Absolute: 0.2 K/uL (ref 0.0–0.7)
Eosinophils Relative: 2.6 % (ref 0.0–5.0)
HCT: 42.2 % (ref 36.0–46.0)
Hemoglobin: 14.1 g/dL (ref 12.0–15.0)
Lymphocytes Relative: 26.8 % (ref 12.0–46.0)
Lymphs Abs: 1.9 K/uL (ref 0.7–4.0)
MCHC: 33.4 g/dL (ref 30.0–36.0)
MCV: 87 fl (ref 78.0–100.0)
Monocytes Absolute: 0.6 K/uL (ref 0.1–1.0)
Monocytes Relative: 8.8 % (ref 3.0–12.0)
Neutro Abs: 4.4 K/uL (ref 1.4–7.7)
Neutrophils Relative %: 60.7 % (ref 43.0–77.0)
Platelets: 261 K/uL (ref 150.0–400.0)
RBC: 4.85 Mil/uL (ref 3.87–5.11)
RDW: 13.1 % (ref 11.5–15.5)
WBC: 7.3 K/uL (ref 4.0–10.5)

## 2024-06-05 LAB — VITAMIN D 25 HYDROXY (VIT D DEFICIENCY, FRACTURES): VITD: 34.83 ng/mL (ref 30.00–100.00)

## 2024-06-05 LAB — TSH: TSH: 1.29 u[IU]/mL (ref 0.35–5.50)

## 2024-06-05 NOTE — Progress Notes (Signed)
 [facility] [address] [place] [address] Dept: [phone] Dept Fax: [phone]  New Patient Office Visit  Subjective:   Connie Hartman [birth year] 06/05/2024  Chief Complaint  Patient presents with   Establish Care    Discuss lab work     HPI: Connie Hartman presents today to establish care at [facility]. Introduced to Publishing rights manager role and practice setting.  All questions answered.  Concerns: See below   Discussed the use of AI scribe software for clinical note transcription with the patient, who gave verbal consent to proceed.  History of Present Illness    She is here to establish care as a new patient and to have blood work done as requested by her psychiatrist. The requested tests include thyroid function tests, a complete blood count (CBC), and a vitamin D  level.  She has a history of anxiety and depression and is currently under psychiatric care. She has tried various medications in the past, including sertraline  and venlafaxine, but is currently taking an off-brand version of Vyvanse and desvenlafaxine (Pristiq). Vyvanse has been particularly helpful for her social anxiety and motivation, enabling her to attend this appointment. She reports that her psychiatrist is considering a possible diagnosis of ADHD, and she feels the medication may be helping with her symptoms.  She has not had any surgeries in the past and does not recall her last annual physical, estimating it might have been in 2018 during high school.     06/05/2024    1:35 PM  Depression screen PHQ 2/9  Decreased Interest 0  Down, Depressed, Hopeless 0  PHQ - 2 Score 0  Altered sleeping 0  Tired, decreased energy 0  Change in appetite 0  Feeling bad or failure about yourself  0  Trouble concentrating 0  Moving slowly or fidgety/restless 0  Suicidal thoughts 0  PHQ-9 Score 0  Difficult doing work/chores Not  difficult at all      06/05/2024    1:35 PM  GAD 7 : Generalized Anxiety Score  Nervous, Anxious, on Edge 0  Control/stop worrying 0  Worry too much - different things 0  Trouble relaxing 0  Restless 0  Easily annoyed or irritable 0  Afraid - awful might happen 0  Total GAD 7 Score 0  Anxiety Difficulty Not difficult at all       The following portions of the patient's history were reviewed and updated as appropriate: past medical history, past surgical history, family history, social history, allergies, medications, and problem list.   Patient Active Problem List   Diagnosis Date Noted   Social anxiety disorder 12/22/2013   Major depressive disorder, recurrent episode, in partial remission (HCC) 12/21/2013   Past Medical History:  Diagnosis Date   Depression    History reviewed. No pertinent surgical history. History reviewed. No pertinent family history.  Current Outpatient Medications:    clindamycin (CLEOCIN T) 1 % lotion, Apply to affected areas once daily in the AM, Disp: , Rfl:    desvenlafaxine (PRISTIQ) 50 MG 24 hr tablet, Take by mouth., Disp: , Rfl:    lisdexamfetamine (VYVANSE) 10 MG capsule, Take 10 mg by mouth daily., Disp: , Rfl:    tretinoin (RETIN-A) 0.025 % cream, Apply pea sized amount (1G) to affected areas 3-5 nights a week, as tolerated., Disp: , Rfl:  No Known Allergies  ROS: A complete ROS was performed with pertinent positives/negatives noted in the HPI. The remainder of  the ROS are negative.   Objective:   Today's Vitals   06/05/24 1311  BP: 100/70  Pulse: 94  Temp: 98 F (36.7 C)  TempSrc: Temporal  SpO2: 96%  Weight: 108 lb 12.8 oz (49.4 kg)  Height: 5' 3 (1.6 m)    GENERAL: Well-appearing, in NAD. Well nourished.  SKIN: Pink, warm and dry.  NECK: Trachea midline. Full ROM w/o pain or tenderness. No lymphadenopathy.  RESPIRATORY: Chest wall symmetrical. Respirations even and non-labored. Breath sounds clear to auscultation  bilaterally.  CARDIAC: S1, S2 present, regular rate and rhythm. Peripheral pulses 2+ bilaterally.  EXTREMITIES: Without clubbing, cyanosis, or edema.  NEUROLOGIC: No motor or sensory deficits. Steady, even gait.  PSYCH/MENTAL STATUS: Alert, oriented x 3. Cooperative, appropriate mood and affect.   Health Maintenance Due  Topic Date Due   HIV Screening  Never done   Hepatitis C Screening  Never done   Hepatitis B Vaccines (1 of 3 - 19+ 3-dose series) Never done   Cervical Cancer Screening (Pap smear)  Never done    No results found for any visits on 06/05/24.  Assessment & Plan:  Assessment and Plan    Depression and social anxiety disorder Under psychiatric care. Vyvanse improved social anxiety. Desvenlafaxine restarted, effectiveness pending. - Continue Vyvanse for social anxiety. - Continue desvenlafaxine for depression. - Order blood work: thyroid function tests, CBC, vitamin D  level      Orders Placed This Encounter  Procedures   TSH   CBC w/Diff   VITAMIN D  25 Hydroxy (Vit-D Deficiency, Fractures)   No orders of the defined types were placed in this encounter.   Return in about 4 months (around 10/05/2024) for Annual Physical Exam with fasting lab work.   Rosina Senters, FNP

## 2024-06-06 ENCOUNTER — Ambulatory Visit: Payer: Self-pay | Admitting: Internal Medicine

## 2024-10-04 ENCOUNTER — Encounter: Admitting: Internal Medicine

## 2024-11-12 ENCOUNTER — Ambulatory Visit (INDEPENDENT_AMBULATORY_CARE_PROVIDER_SITE_OTHER): Admitting: Internal Medicine

## 2024-11-12 ENCOUNTER — Encounter: Payer: Self-pay | Admitting: Internal Medicine

## 2024-11-12 VITALS — BP 100/70 | HR 93 | Temp 98.0°F | Ht 62.0 in | Wt 106.2 lb

## 2024-11-12 DIAGNOSIS — Z0000 Encounter for general adult medical examination without abnormal findings: Secondary | ICD-10-CM

## 2024-11-12 NOTE — Patient Instructions (Signed)
Return for fasting blood work

## 2024-11-12 NOTE — Progress Notes (Signed)
 "  Subjective:   Connie Hartman 2001-03-25  11/12/2024   CC: Chief Complaint  Patient presents with   Annual Exam    Pt not fasting, no concerns    HPI: Connie Hartman is a 24 y.o. female who presents for a routine health maintenance exam.  Labs not collected at time of visit. She will return for fasting lab work.    HEALTH SCREENINGS: - Pap smear: declined - Mammogram (40+): Not applicable  - Colonoscopy (45+): Not applicable  - Bone Density (65+): Not applicable  - Lung CA screening with low-dose CT:  Not applicable Adults age 81-80 who are current cigarette smokers or quit within the last 15 years. Must have 20 pack year history.   IMMUNIZATIONS: - Tdap: Tetanus vaccination status reviewed: declined. - HPV: Refused - Influenza: Refused - Prevnar 20: Not applicable - Zostavax (50+): Not applicable   Past medical history, surgical history, medications, allergies, family history and social history reviewed with patient today and changes made to appropriate areas of the chart.   Social History   Socioeconomic History   Marital status: Single    Spouse name: Not on file   Number of children: Not on file   Years of education: Not on file   Highest education level: Some college, no degree  Occupational History   Not on file  Tobacco Use   Smoking status: Never   Smokeless tobacco: Never  Vaping Use   Vaping status: Never Used  Substance and Sexual Activity   Alcohol use: No   Drug use: No   Sexual activity: Never    Birth control/protection: Abstinence  Other Topics Concern   Not on file  Social History Narrative   Not on file   Social Drivers of Health   Tobacco Use: Low Risk (11/12/2024)   Patient History    Smoking Tobacco Use: Never    Smokeless Tobacco Use: Never    Passive Exposure: Not on file  Financial Resource Strain: Low Risk (11/11/2024)   Overall Financial Resource Strain (CARDIA)    Difficulty of Paying Living Expenses: Not hard at all  Food  Insecurity: No Food Insecurity (11/11/2024)   Epic    Worried About Programme Researcher, Broadcasting/film/video in the Last Year: Never true    Ran Out of Food in the Last Year: Never true  Transportation Needs: No Transportation Needs (11/11/2024)   Epic    Lack of Transportation (Medical): No    Lack of Transportation (Non-Medical): No  Physical Activity: Inactive (11/11/2024)   Exercise Vital Sign    Days of Exercise per Week: 0 days    Minutes of Exercise per Session: Not on file  Stress: No Stress Concern Present (11/11/2024)   [employer] of Occupational Health - Occupational Stress Questionnaire    Feeling of Stress: Not at all  Social Connections: Socially Isolated (11/11/2024)   Social Connection and Isolation Panel    Frequency of Communication with Friends and Family: Once a week    Frequency of Social Gatherings with Friends and Family: Never    Attends Religious Services: Never    Database Administrator or Organizations: No    Attends Banker Meetings: Not on file    Marital Status: Never married  Intimate Partner Violence: Not on file  Depression (PHQ2-9): Low Risk (11/12/2024)   Depression (PHQ2-9)    PHQ-2 Score: 2  Alcohol Screen: Not on file  Housing: Unknown (11/11/2024)   Epic    Unable to Pay  for Housing in the Last Year: No    Number of Times Moved in the Last Year: Not on file    Homeless in the Last Year: No  Utilities: Not on file  Health Literacy: Not on file     Past Medical History:  Diagnosis Date   Depression     History reviewed. No pertinent surgical history.  Current Outpatient Medications on File Prior to Visit  Medication Sig   clindamycin (CLEOCIN T) 1 % lotion Apply to affected areas once daily in the AM   desvenlafaxine (PRISTIQ) 50 MG 24 hr tablet Take by mouth.   lisdexamfetamine (VYVANSE) 10 MG capsule Take 10 mg by mouth daily.   tretinoin (RETIN-A) 0.025 % cream Apply pea sized amount (1G) to affected areas 3-5 nights a week, as  tolerated.   No current facility-administered medications on file prior to visit.    Allergies[1]  History reviewed. No pertinent family history.   ROS: Denies fever, fatigue, unexplained weight loss/gain, hearing or vision changes, cardiac or respiratory complaints. Denies neurological deficits, musculoskeletal complaints, gastrointestinal or genitourinary complaints, mental health complaints, and skin changes.   Objective:   Today's Vitals   11/12/24 1518  BP: 100/70  Pulse: 93  Temp: 98 F (36.7 C)  TempSrc: Temporal  SpO2: 95%  Weight: 106 lb 3.2 oz (48.2 kg)  Height: 5' 2 (1.575 m)    GENERAL APPEARANCE: Well-appearing, in NAD. Well nourished.  SKIN: Pink, warm and dry. Turgor normal. No rash, lesion, ulceration, or ecchymoses. Hair evenly distributed.  HEENT: HEAD: Normocephalic.  EYES: PERRLA. EOMI. Lids intact w/o defect. Sclera white, Conjunctiva pink w/o exudate.  EARS: External ear w/o redness, swelling, masses or lesions. EAC clear. TM's intact, translucent w/o bulging, appropriate landmarks visualized. Appropriate acuity to conversational tones.  NOSE: Septum midline w/o deformity. Nares patent, mucosa pink and non-inflamed w/o drainage.  THROAT: Uvula midline. Oropharynx clear. Tonsils non-inflamed w/o exudate. Oral mucosa pink and moist.  NECK: Supple, Trachea midline. Full ROM w/o pain or tenderness. No lymphadenopathy. Thyroid  non-tender w/o enlargement or palpable masses.  RESPIRATORY: Chest wall symmetrical w/o masses. Respirations even and non-labored. Breath sounds clear to auscultation bilaterally. No wheezes, rales, rhonchi, or crackles. CARDIAC: S1, S2 present, regular rate and rhythm. No gallops, murmurs, rubs, or clicks. Capillary refill <2 seconds. Peripheral pulses 2+ bilaterally. GI: Abdomen soft w/o distention. Normoactive bowel sounds. No palpable masses or tenderness. No guarding or rebound tenderness. Liver and spleen w/o tenderness or  enlargement. No CVA tenderness.  MSK: Muscle tone and strength appropriate for age, w/o atrophy or abnormal movement.  EXTREMITIES: Active ROM intact, w/o tenderness, crepitus, or contracture. No obvious joint deformities or effusions. No clubbing, edema, or cyanosis.  NEUROLOGIC: CN's II-XII intact. Motor strength symmetrical with no obvious weakness. No sensory deficits. Steady, even gait.  PSYCH/MENTAL STATUS: Alert, oriented x 3. Cooperative, appropriate mood and affect.   Depression and Anxiety Screen done today and results listed below:     11/12/2024    3:22 PM 06/05/2024    1:35 PM  Depression screen PHQ 2/9  Decreased Interest 1 0  Down, Depressed, Hopeless 1 0  PHQ - 2 Score 2 0  Altered sleeping 0 0  Tired, decreased energy 0 0  Change in appetite 0 0  Feeling bad or failure about yourself  0 0  Trouble concentrating 0 0  Moving slowly or fidgety/restless 0 0  Suicidal thoughts 0 0  PHQ-9 Score 2 0   Difficult doing work/chores  Not difficult at all Not difficult at all     Data saved with a previous flowsheet row definition      11/12/2024    3:22 PM 06/05/2024    1:35 PM  GAD 7 : Generalized Anxiety Score  Nervous, Anxious, on Edge 0 0  Control/stop worrying 0 0  Worry too much - different things 0 0  Trouble relaxing 0 0  Restless 0 0  Easily annoyed or irritable 0 0  Afraid - awful might happen 0 0  Total GAD 7 Score 0 0  Anxiety Difficulty Not difficult at all Not difficult at all     Assessment & Plan:  Annual physical exam -     CBC with Differential/Platelet; Future -     Comprehensive metabolic panel with GFR; Future -     Lipid panel; Future -     TSH; Future    Orders Placed This Encounter  Procedures   CBC with Differential/Platelet    Standing Status:   Future    Expiration Date:   05/12/2025   Comprehensive metabolic panel with GFR    Standing Status:   Future    Expiration Date:   05/12/2025   Lipid panel    Standing Status:   Future     Expiration Date:   05/12/2025   TSH    Standing Status:   Future    Expiration Date:   05/12/2025    PATIENT COUNSELING:  - Encouraged a healthy well-balanced diet. Patient may adjust caloric intake to maintain or achieve ideal body weight. May reduce intake of dietary saturated fat and total fat and have adequate dietary potassium and calcium preferably from fresh fruits, vegetables, and low-fat dairy products.   - Advised to avoid cigarette smoking. - Discussed with the patient that most people either abstain from alcohol or drink within safe limits (<=14/week and <=4 drinks/occasion for males, <=7/weeks and <= 3 drinks/occasion for females) and that the risk for alcohol disorders and other health effects rises proportionally with the number of drinks per week and how often a drinker exceeds daily limits. - Discussed cessation/primary prevention of drug use and availability of treatment for abuse.  - Discussed sexually transmitted diseases, avoidance of unintended pregnancy and contraceptive alternatives.  - Stressed the importance of regular exercise  NEXT PREVENTATIVE PHYSICAL DUE IN 1 YEAR.  Return in about 1 year (around 11/12/2025) for Annual Physical Exam with fasting lab work.  Rosina Senters, FNP      [1] No Known Allergies  "

## 2024-11-14 ENCOUNTER — Other Ambulatory Visit

## 2024-11-14 ENCOUNTER — Ambulatory Visit: Payer: Self-pay | Admitting: Internal Medicine

## 2024-11-14 DIAGNOSIS — Z0000 Encounter for general adult medical examination without abnormal findings: Secondary | ICD-10-CM

## 2024-11-14 LAB — TSH: TSH: 1.68 u[IU]/mL (ref 0.35–5.50)

## 2024-11-14 LAB — CBC WITH DIFFERENTIAL/PLATELET
Basophils Absolute: 0.1 K/uL (ref 0.0–0.1)
Basophils Relative: 0.9 % (ref 0.0–3.0)
Eosinophils Absolute: 0.3 K/uL (ref 0.0–0.7)
Eosinophils Relative: 4.2 % (ref 0.0–5.0)
HCT: 42.3 % (ref 36.0–46.0)
Hemoglobin: 14.2 g/dL (ref 12.0–15.0)
Lymphocytes Relative: 39 % (ref 12.0–46.0)
Lymphs Abs: 2.8 K/uL (ref 0.7–4.0)
MCHC: 33.5 g/dL (ref 30.0–36.0)
MCV: 87.6 fl (ref 78.0–100.0)
Monocytes Absolute: 0.6 K/uL (ref 0.1–1.0)
Monocytes Relative: 8.9 % (ref 3.0–12.0)
Neutro Abs: 3.3 K/uL (ref 1.4–7.7)
Neutrophils Relative %: 47 % (ref 43.0–77.0)
Platelets: 253 K/uL (ref 150.0–400.0)
RBC: 4.83 Mil/uL (ref 3.87–5.11)
RDW: 12.9 % (ref 11.5–15.5)
WBC: 7.1 K/uL (ref 4.0–10.5)

## 2024-11-14 LAB — COMPREHENSIVE METABOLIC PANEL WITH GFR
ALT: 15 U/L (ref 3–35)
AST: 18 U/L (ref 5–37)
Albumin: 4.4 g/dL (ref 3.5–5.2)
Alkaline Phosphatase: 30 U/L — ABNORMAL LOW (ref 39–117)
BUN: 7 mg/dL (ref 6–23)
CO2: 29 meq/L (ref 19–32)
Calcium: 8.9 mg/dL (ref 8.4–10.5)
Chloride: 103 meq/L (ref 96–112)
Creatinine, Ser: 0.64 mg/dL (ref 0.40–1.20)
GFR: 124.56 mL/min
Glucose, Bld: 83 mg/dL (ref 70–99)
Potassium: 4 meq/L (ref 3.5–5.1)
Sodium: 137 meq/L (ref 135–145)
Total Bilirubin: 0.6 mg/dL (ref 0.2–1.2)
Total Protein: 7.2 g/dL (ref 6.0–8.3)

## 2024-11-14 LAB — LIPID PANEL
Cholesterol: 178 mg/dL (ref 28–200)
HDL: 53 mg/dL
LDL Cholesterol: 111 mg/dL — ABNORMAL HIGH (ref 10–99)
NonHDL: 125.12
Total CHOL/HDL Ratio: 3
Triglycerides: 70 mg/dL (ref 10.0–149.0)
VLDL: 14 mg/dL (ref 0.0–40.0)

## 2025-11-14 ENCOUNTER — Encounter: Admitting: Internal Medicine
